# Patient Record
Sex: Male | Born: 1980 | Race: Black or African American | Hispanic: No | Marital: Single | Smoking: Never smoker
Health system: Southern US, Community
[De-identification: ages and names within clinical notes are randomized; demographics above are authoritative.]

---

## 2007-06-10 ENCOUNTER — Emergency Department (HOSPITAL_COMMUNITY): Admission: EM | Admit: 2007-06-10 | Discharge: 2007-06-10 | Payer: Self-pay | Admitting: Emergency Medicine

## 2007-09-26 ENCOUNTER — Ambulatory Visit: Payer: Self-pay | Admitting: Family Medicine

## 2008-02-27 ENCOUNTER — Ambulatory Visit: Payer: Self-pay | Admitting: Family Medicine

## 2009-11-30 ENCOUNTER — Ambulatory Visit: Payer: Self-pay | Admitting: Family Medicine

## 2009-12-08 ENCOUNTER — Encounter: Admission: RE | Admit: 2009-12-08 | Discharge: 2009-12-08 | Payer: Self-pay | Admitting: Family Medicine

## 2009-12-14 ENCOUNTER — Ambulatory Visit: Payer: Self-pay | Admitting: Physician Assistant

## 2010-08-03 ENCOUNTER — Ambulatory Visit (INDEPENDENT_AMBULATORY_CARE_PROVIDER_SITE_OTHER): Payer: 59 | Admitting: Family Medicine

## 2010-08-03 ENCOUNTER — Other Ambulatory Visit: Payer: Self-pay | Admitting: Family Medicine

## 2010-08-03 ENCOUNTER — Ambulatory Visit
Admission: RE | Admit: 2010-08-03 | Discharge: 2010-08-03 | Disposition: A | Discharge status: Home / Self Care | Payer: 59 | Source: Ambulatory Visit | Attending: Family Medicine | Admitting: Family Medicine

## 2010-08-03 DIAGNOSIS — M25539 Pain in unspecified wrist: Secondary | ICD-10-CM

## 2010-08-03 DIAGNOSIS — R52 Pain, unspecified: Secondary | ICD-10-CM

## 2010-12-15 ENCOUNTER — Encounter: Payer: Self-pay | Admitting: Family Medicine

## 2011-01-05 ENCOUNTER — Encounter: Payer: Self-pay | Admitting: Family Medicine

## 2011-01-05 ENCOUNTER — Ambulatory Visit (INDEPENDENT_AMBULATORY_CARE_PROVIDER_SITE_OTHER): Payer: 59 | Admitting: Family Medicine

## 2011-01-05 VITALS — BP 112/80 | HR 76 | Ht 71.0 in | Wt 226.0 lb

## 2011-01-05 DIAGNOSIS — B029 Zoster without complications: Secondary | ICD-10-CM

## 2011-01-05 MED ORDER — VALACYCLOVIR HCL 1 G PO TABS: 1000.0000 mg | ORAL_TABLET | Freq: Three times a day (TID) | ORAL | Status: AC

## 2011-01-05 NOTE — Patient Instructions (Signed)
I have marked the area on your shoulder that is red and raised.  I suspect that you have shingles, based on the appearance of a rash in the same nerve distribution farther down your arm.  However, if the redness and warmth spread beyond the marked area on your shoulder, then it is possible there is a bacterial infection that needs to be treated.  Please follow up if you start having fevers, and spreading redness/warmth/rash  Shingles (Herpes Zoster) Shingles is caused by the same virus that causes chicken pox (varicella zoster virus or VZV). Shingles often occurs many years or decades after having chicken pox. That is why it is more common in adults older than 50 years. The virus reactivates and breaks out as an infection in a nerve root. SYMPTOMS  The initial feeling (sensations) may be pain. This pain is usually described as:   Burning.   Stabbing.   Throbbing.   Tingling in the nerve root.   A red rash will follow in a couple days. The rash may occur in any area of the body and is usually on one side (unilateral) of the body in a band or belt-like pattern. The rash usually starts out as very small blisters (vesicles). They will dry up after 7 to 10 days. This is not usually a significant problem except for the pain it causes.   Long lasting (chronic) pain is more likely in an elderly person. It can last months to years. This condition is called post-herpetic neuralgia.  Shingles can be an extremely severe infection in someone with AIDS, a weakened immune system or with forms of leukemia. It can also be severe if you are taking transplant medications or other medications that weaken the immune system. TREATMENT Your caregiver will often treat you with:  Antiviral drugs.   Anti-inflammatory drugs.   Pain medications.   Bed rest is very important in preventing the pain associated with herpes zoster (post-herpetic neuralgia).   Application of heat in the form of a hot-water bottle or  electric heating pad or gentle pressure with the hand is recommended to help with the pain or discomfort.  PREVENTION A varicella zoster vaccine is available to help protect against the virus. The Food and Drug Administration approved the varicella zoster vaccine for individuals 20 years of age and older. HOME CARE INSTRUCTIONS  Cool compresses to the area of rash may be helpful.   Only take over-the-counter or prescription medicines for pain, discomfort or fever as directed by your caregiver.   Avoid contact with:   Babies.   Pregnant women.   Children with eczema.   Elderly people with transplants.   People with chronic illnesses, such as leukemia and AIDS.   If the area involved is on your face, you may receive a referral for follow-up to a specialist. It is very important to keep all follow-up appointments. This will help avoid eye complications, chronic pain or disability.  SEEK IMMEDIATE MEDICAL CARE IF:  You develop any pain (headache) in the area of the face or eye. This must be followed carefully by your caregiver or ophthalmologist. An infection in part of your eye (cornea) can be very serious. It could lead to blindness.   You do not have pain relief from prescribed medications.   The redness or swelling spreads.   The area involved becomes very swollen and painful.   You have an oral temperature above 102 F (38 C), not controlled by medicine.   You notice any  red or painful lines extending away from the affected area toward your heart (lymphangitis).   Your condition is worsening or has changed.  Document Released: 05/01/2005 Document Re-Released: 10/19/2009 [facility] Patient Information 2011 [place], [place].

## 2011-01-05 NOTE — Progress Notes (Signed)
  Subjective:    Patient ID: Brandon Matthews, male    DOB: [birth year], 30 y.o.   MRN: [id number]  HPI Patient presents for evaluation of rash.  He returned from a cruise to the [place] on [place].  The following day he noted a rash on his right shoulder--it started out small, but has gotten larger and more painful.  Now has blisters, some of which have opened.  He has been using topical antibacterial ointment to the area.  Yesterday, he noticed the onset of rash farther down his arm, near biceps.  Started out small, just as the original area did.  Denies itching. Denies new creams, contacts, lotions, insect bites, or other possible contacts   Review of Systems Denies fevers, URI symptoms, pruritis, other rashes or concerns   Objective:   Physical Exam BP 112/80  Pulse 76  Ht 5\' 11"  (1.803 m)  Wt 226 lb (102.513 kg)  BMI 31.52 kg/m2 R shoulder--clustered area of vesicles on an erythematous base.  This erythematous base seems to have slight soft tissue swelling.  No red streaking.  On R upper arm, there is a cluster of tiny pustules and vesicles, without any surrounding erythema       Assessment & Plan:   1. Shingles  valACYclovir (VALTREX) 1000 MG tablet   Suspect shingles.  Given slight swelling and redness, I marked the area, and will have patient observe for spread.  If increasing in size, will contact the office and will treat with ABX to cover cellulitis  Discussed reason to treat shingles, and that he is contagious to those at risk for varicella.  See handout

## 2012-11-08 IMAGING — CR DG WRIST COMPLETE 3+V*R*
2 series · 2 of 2 positions shown · non-contrast
Comparison: none

CLINICAL DATA: Sports injury 6 weeks ago.  Persistent lateral pain.

RIGHT WRIST - COMPLETE 3+ VIEW

[view not recorded (1 of 2)]
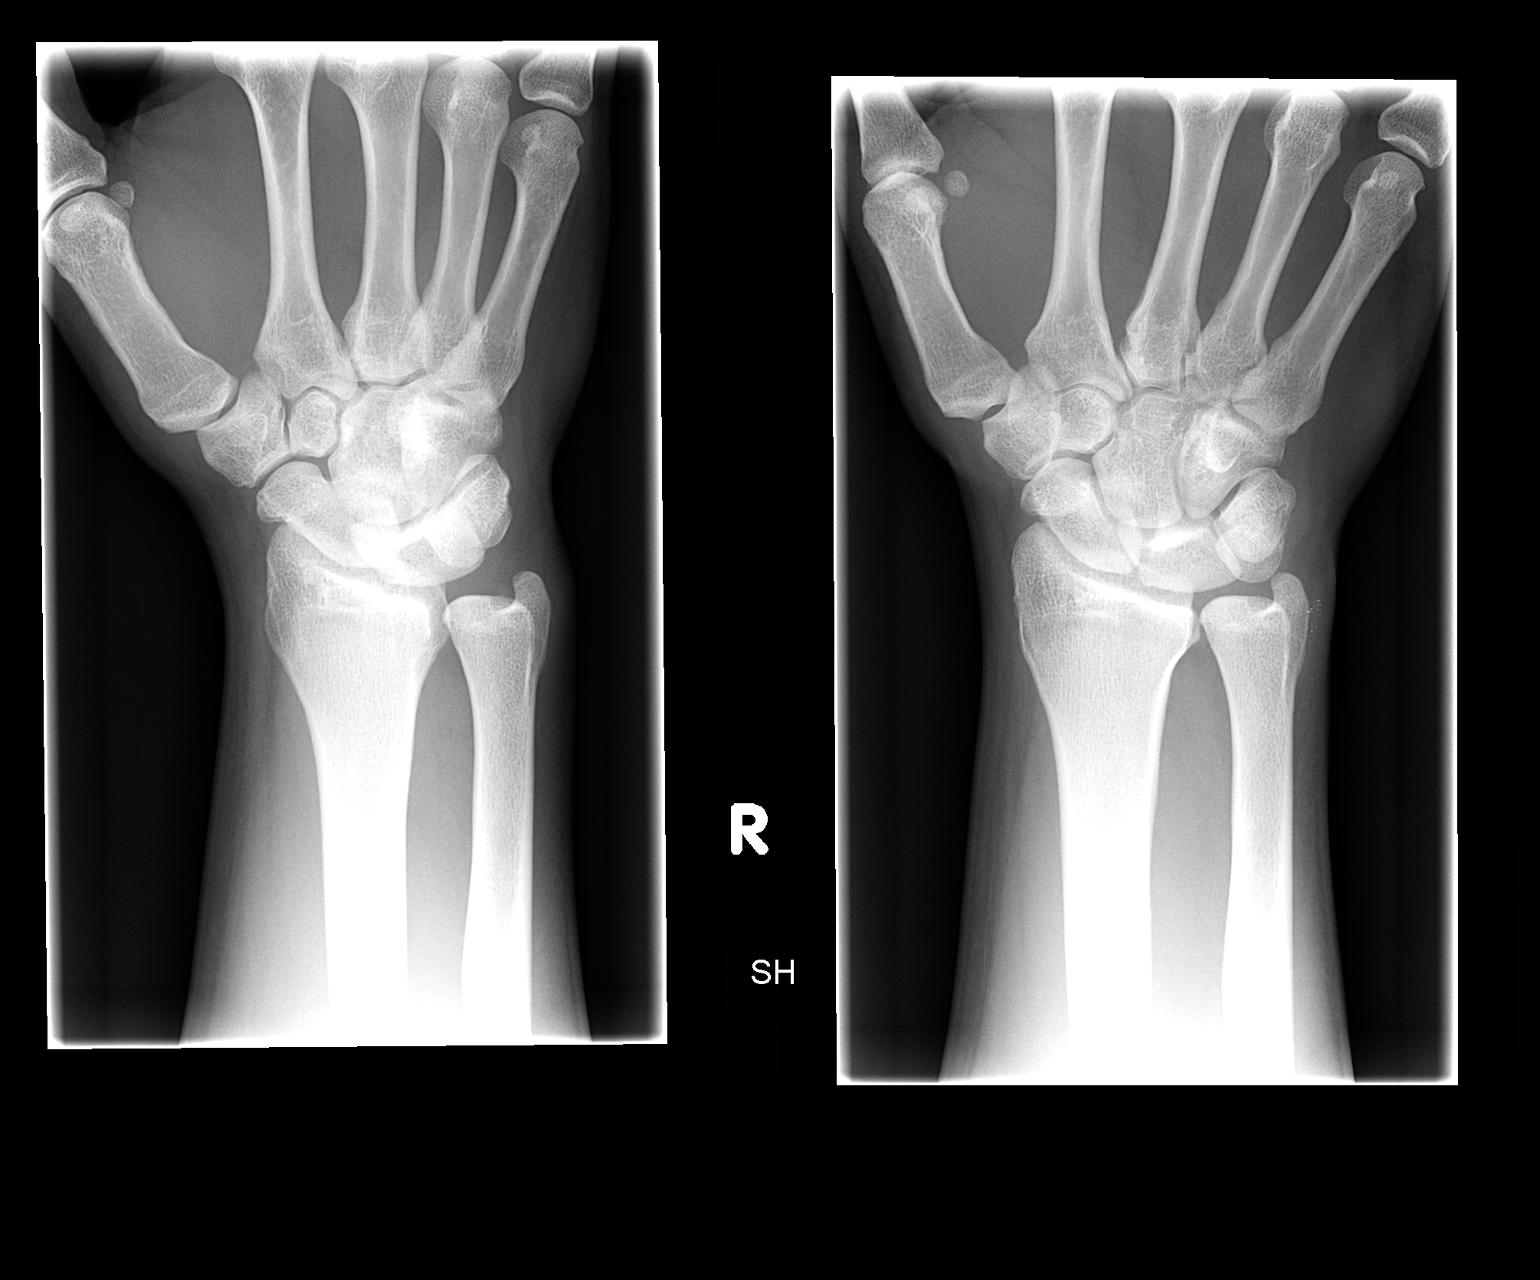

[view not recorded (2 of 2)]
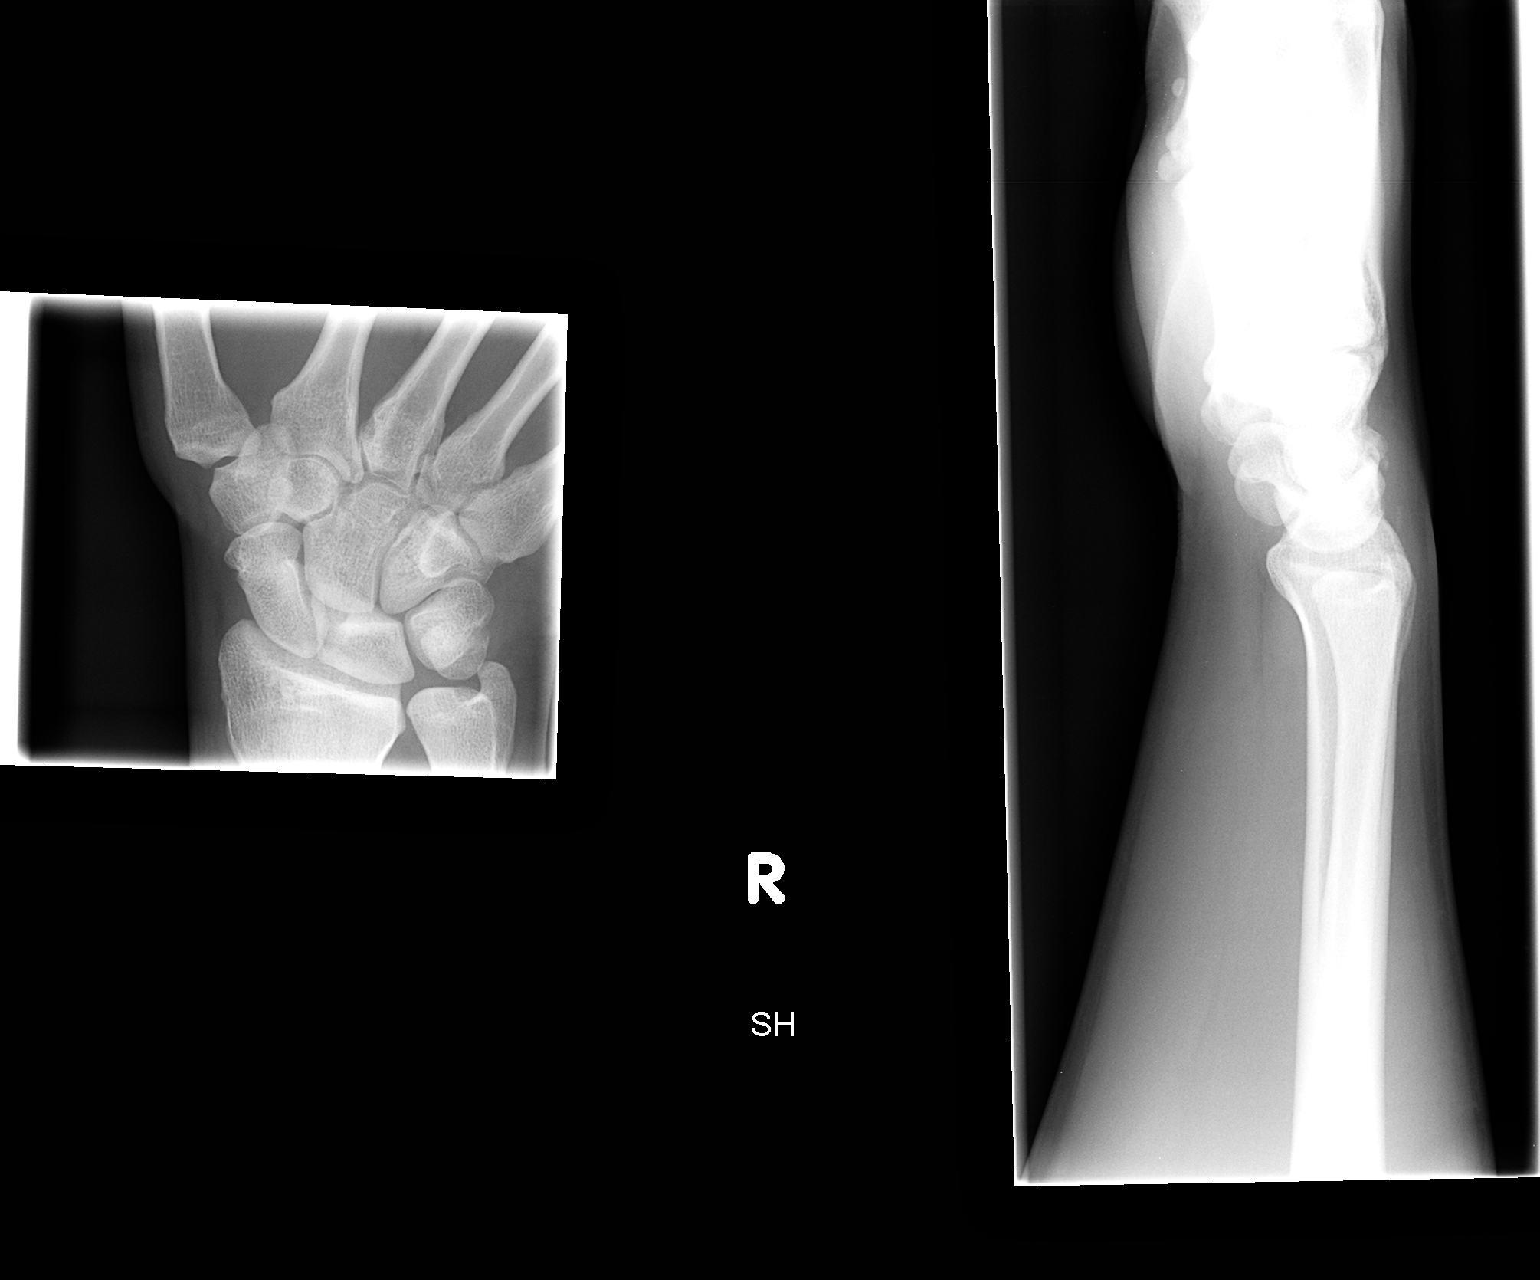

[2 of 2 positions shown; findings below may reference images not displayed]

FINDINGS: Distal radius and ulna appear normal.  Spaces between the
carpal bones appear normal.  There is a tiny density dorsal to the
carpus on the lateral view that could represent a tiny triquetral
fracture.  This is not definite, but is suspicious.
IMPRESSION: Question tiny calcific density dorsal to the carpus on the lateral
view suspicious for a minor triquetral fracture.  The exam
otherwise appears normal.

## 2013-07-11 ENCOUNTER — Ambulatory Visit (INDEPENDENT_AMBULATORY_CARE_PROVIDER_SITE_OTHER): Payer: 59 | Admitting: Medical

## 2013-07-11 ENCOUNTER — Encounter: Payer: Self-pay | Admitting: Medical

## 2013-07-11 VITALS — BP 102/70 | HR 60 | Temp 98.2°F | Resp 14 | Wt 221.0 lb

## 2013-07-11 DIAGNOSIS — IMO0002 Reserved for concepts with insufficient information to code with codable children: Secondary | ICD-10-CM

## 2013-07-11 DIAGNOSIS — S8392XA Sprain of unspecified site of left knee, initial encounter: Secondary | ICD-10-CM

## 2013-07-11 NOTE — Patient Instructions (Signed)
  Thank you for giving me the opportunity to serve you today.    Your diagnosis today includes: Encounter Diagnosis  Name Primary?  . Left knee sprain Yes    Specific recommendations today include:  RICE  Rest the knee the next 4-5 days  Ice 20 minutes at a time  Compression with ACE wrap or knee sleeve  Elevated the leg on pillows when seated  Consider crutches   Use Ibuprofen OTC 4 tablets, three times daily  If not much improved in 1-2 weeks, then recheck  Follow up: if not improving in 1-2 weeks

## 2013-07-11 NOTE — Progress Notes (Signed)
   Subjective:   Juanetta SnowBrooks Lemere is a 33 y.o. male presenting on 07/11/2013 with left knee pain  Was playing basketball yesterday, went for lay up, and came down on the leg funny.  When he landed he didn't have immediate pain, but when he stepped back, something didn't feel quite right.  He jumped off the left leg, landed on the right leg first from the lay up.   Denies popping, knee didn't give way.  He stopped playing after this.  Since then has had some mild swelling.  Pain is 7/10 pain.   Denies numbness, tingling, weakness.  No hip or ankle pain.  No right leg pain.  Denies hx/o left knee problems.  No other aggravating or relieving factors.  No other complaint.  Review of Systems ROS as in subjective      Objective:     Filed Vitals:   07/11/13 1334  BP: 102/70  Pulse: 60  Temp: 98.2 F (36.8 C)  Resp: 14    General appearance: alert, no distress, WD/WN MSK: left knee patella mildly tender, mild pain with knee flexion and extension, most pain if bearing weight.  Minimal swelling noted.  No laxity, negative Lachman and Mcmurray.  Rest of left knee and leg ROM normal.   Leg neurovascularly intact      Assessment: Encounter Diagnosis  Name Primary?  . Left knee sprain Yes     Plan: Currently symptoms and exam suggests sprain, mild.  Discussed RICE, rest, ice compression, elevation, ibuprofen, and staying off the leg this weekend.  Gradually return to elliptical machine for exercise after 4-5 days of rest.  Gradually resume activity as tolerated for several days before resuming basketball.  If not improving within 1 week, then recheck.   Shon BatonBrooks was seen today for left knee pain.  Diagnoses and associated orders for this visit:  Left knee sprain    Return if symptoms worsen or fail to improve.
